# Patient Record
Sex: Female | Born: 1995 | Race: Black or African American | Hispanic: No | Marital: Single | Smoking: Never smoker
Health system: Southern US, Community
[De-identification: ages and names within clinical notes are randomized; demographics above are authoritative.]

---
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
## 2016-09-28 ENCOUNTER — Emergency Department (HOSPITAL_COMMUNITY): Payer: No Typology Code available for payment source

## 2016-09-28 ENCOUNTER — Encounter (HOSPITAL_COMMUNITY): Payer: Self-pay | Admitting: *Deleted

## 2016-09-28 ENCOUNTER — Emergency Department (HOSPITAL_COMMUNITY)
Admission: EM | Admit: 2016-09-28 | Discharge: 2016-09-28 | Disposition: A | Discharge status: Home / Self Care | Payer: No Typology Code available for payment source | Attending: Emergency Medicine | Admitting: Emergency Medicine

## 2016-09-28 DIAGNOSIS — R6889 Other general symptoms and signs: Secondary | ICD-10-CM

## 2016-09-28 DIAGNOSIS — R509 Fever, unspecified: Secondary | ICD-10-CM | POA: Insufficient documentation

## 2016-09-28 DIAGNOSIS — R110 Nausea: Secondary | ICD-10-CM | POA: Insufficient documentation

## 2016-09-28 MED ORDER — ACETAMINOPHEN 325 MG PO TABS
ORAL_TABLET | ORAL | Status: AC
  Filled 2016-09-28: qty 2

## 2016-09-28 MED ORDER — ONDANSETRON 4 MG PO TBDP: 4.0000 mg | ORAL_TABLET | Freq: Three times a day (TID) | ORAL | 0 refills | Status: AC | PRN

## 2016-09-28 MED ORDER — ACETAMINOPHEN 325 MG PO TABS
650.0000 mg | ORAL_TABLET | Freq: Once | ORAL | Status: AC | PRN
  Administered 2016-09-28: 650 mg via ORAL

## 2016-09-28 MED ORDER — BENZONATATE 100 MG PO CAPS: 100.0000 mg | ORAL_CAPSULE | Freq: Three times a day (TID) | ORAL | 0 refills | Status: AC

## 2016-09-28 MED ORDER — IBUPROFEN 800 MG PO TABS: 800.0000 mg | ORAL_TABLET | Freq: Three times a day (TID) | ORAL | 0 refills | Status: AC

## 2016-09-28 NOTE — Discharge Instructions (Signed)
As we discussed, your chest x-ray today was normal.  You likely have a viral illness, possibly the flu. Take the prescribed medications as directed.  Make sure to drink fluids to stay hydrated.  Rest when you can. Follow-up with your primary care doctor. Return to the ED for new or worsening symptoms.

## 2016-09-28 NOTE — ED Provider Notes (Signed)
[facility]-EMERGENCY DEPT Provider Note   CSN: [id number] Arrival date & time: 09/28/16  1417     History   Chief Complaint No chief complaint on file.   HPI Jessica Allison is a 21 y.o. female.  The history is provided by the patient and medical records.    21 y.o. F with no significant PMH presenting to the ED for flu like symptoms x3 days.  Patient reports cough with clear mucous, chest congestion, fever, body aches, Nausea, one episode of emesis this morning, and sore throat. 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 She denies any chest pain or shortness of breath. States symptoms are usually worse in the morning upon waking and gradually improved somewhat during the day. States she has low energy and doesn't really want do anything. She denies any dizziness or syncope.  History reviewed. No pertinent past medical history.  There are no active problems to display for this patient.   History reviewed. No pertinent surgical history.  OB History    No data available       Home Medications    Prior to Admission medications   Medication Sig Start Date End Date Taking? Authorizing Provider  omeprazole (PRILOSEC) 20 MG capsule Take 20 mg by mouth 2 (two) times daily before a meal.    Historical Provider, MD  ondansetron (ZOFRAN) 4 MG tablet Take 1 tablet (4 mg total) by mouth every 6 (six) hours. 03/08/14   Antony Madura, PA-C    Family History No family history on file.  Social History Social History  Substance Use Topics  . Smoking status: Never Smoker  . Smokeless tobacco: Not on file  . Alcohol use No     Allergies   Patient has no known allergies.   Review of Systems Review of Systems  Constitutional: Positive for fever.  HENT: Positive for congestion and sore throat.   Respiratory: Positive for cough.   Gastrointestinal: Positive for nausea and vomiting.  Musculoskeletal: Positive for myalgias.  All other systems reviewed  and are negative.    Physical Exam Updated Vital Signs BP 125/65 (BP Location: Left Arm)   Pulse 105   Temp 103 F (39.4 C) (Oral)   Resp 18   LMP 09/12/2016 (Exact Date)   SpO2 98%   Physical Exam  Constitutional: She is oriented to person, place, and time. She appears well-developed and well-nourished.  HENT:  Head: Normocephalic and atraumatic.  Right Ear: Tympanic membrane and ear canal normal.  Left Ear: Tympanic membrane and ear canal normal.  Nose: Mucosal edema and rhinorrhea present.  Mouth/Throat: Uvula is midline, oropharynx is clear and moist and mucous membranes are normal.  + nasal congestion, clear rhinorrhea, PND noted in oropharynx Tonsils overall normal in appearance bilaterally without exudate; uvula midline without evidence of peritonsillar abscess; handling secretions appropriately; no difficulty swallowing or speaking; normal phonation without stridor  Eyes: Conjunctivae and EOM are normal. Pupils are equal, round, and reactive to light.  Neck: Normal range of motion.  Cardiovascular: Normal rate, regular rhythm and normal heart sounds.   Pulmonary/Chest: Effort normal and breath sounds normal. No respiratory distress. She has no wheezes. She has no rhonchi.  No distress, lungs clear, speaking in sentences without difficulty  Abdominal: Soft. Bowel sounds are normal. There is no tenderness. There is no rebound.  Musculoskeletal: Normal range of motion.  Neurological: She is alert and oriented to person, place, and time.  Skin: Skin is warm and dry.  Psychiatric: She has a normal mood and affect.  Nursing note and vitals reviewed.    ED Treatments / Results  Labs (all labs ordered are listed, but only abnormal results are displayed) Labs Reviewed - No data to display  EKG  EKG Interpretation None       Radiology Dg Chest 2 View  Result Date: 09/28/2016 CLINICAL DATA:  Fever, cough and congestion. Sore throat over the last 2 days. EXAM: CHEST  2  VIEW COMPARISON:  09/04/2005 FINDINGS: Heart size is normal. Mediastinal shadows are normal. The lungs are clear. No bronchial thickening. No infiltrate, mass, effusion or collapse. Pulmonary vascularity is normal. Mild spinal curvature. IMPRESSION: Normal except for mild spinal curvature.  No evidence of pneumonia. Electronically Signed   By: Paulina FusiMark  Shogry M.D.   On: 09/28/2016 16:49    Procedures Procedures (including critical care time)  Medications Ordered in ED Medications  acetaminophen (TYLENOL) 325 MG tablet (not administered)  acetaminophen (TYLENOL) tablet 650 mg (650 mg Oral Given 09/28/16 1444)     Initial Impression / Assessment and Plan / ED Course  I have reviewed the triage vital signs and the nursing notes.  Pertinent labs & imaging results that were available during my care of the patient were reviewed by me and considered in my medical decision making (see chart for details).  Clinical Course    21 year old female here with flulike symptoms for the past 3 days. She was febrile on arrival, nontoxic in appearance. Fever control with Tylenol. Exam with nasal congestion, clear rhinorrhea, postnasal drip, otherwise noninfectious. Lungs are clear without wheezes or rhonchi. Chest x-ray was obtained from triage, no acute findings aside from spinal curvature which is likely chronic. Patient likely with viral process, possibly flu. Given her symptoms have been ongoing for more than 48 hours, she is not a candidate for Tamiflu. Will treat symptomatically. She was given work note. I recommended that she follow-up with her primary care doctor.  Discussed plan with patient, she acknowledged understanding and agreed with plan of care.  Return precautions given for new or worsening symptoms.  Final Clinical Impressions(s) / ED Diagnoses   Final diagnoses:  Flu-like symptoms  Nausea    New Prescriptions New Prescriptions   BENZONATATE (TESSALON) 100 MG CAPSULE    Take 1 capsule (100 mg  total) by mouth every 8 (eight) hours.   IBUPROFEN (ADVIL,MOTRIN) 800 MG TABLET    Take 1 tablet (800 mg total) by mouth 3 (three) times daily.   ONDANSETRON (ZOFRAN ODT) 4 MG DISINTEGRATING TABLET    Take 1 tablet (4 mg total) by mouth every 8 (eight) hours as needed for nausea.     Garlon HatchetLisa M Sanders, PA-C 09/28/16 1931    Charlynne Panderavid Hsienta Yao, MD 09/29/16 [phone]

## 2017-02-02 DIAGNOSIS — H52203 Unspecified astigmatism, bilateral: Secondary | ICD-10-CM | POA: Diagnosis not present

## 2017-02-02 DIAGNOSIS — H5213 Myopia, bilateral: Secondary | ICD-10-CM | POA: Diagnosis not present

## 2017-08-03 IMAGING — DX DG CHEST 2V
2 series · 2 of 2 positions shown · non-contrast
Comparison: 09/04/2005

CLINICAL DATA: Fever, cough and congestion. Sore throat over the
last 2 days.

EXAM:
CHEST  2 VIEW

[chest pa]
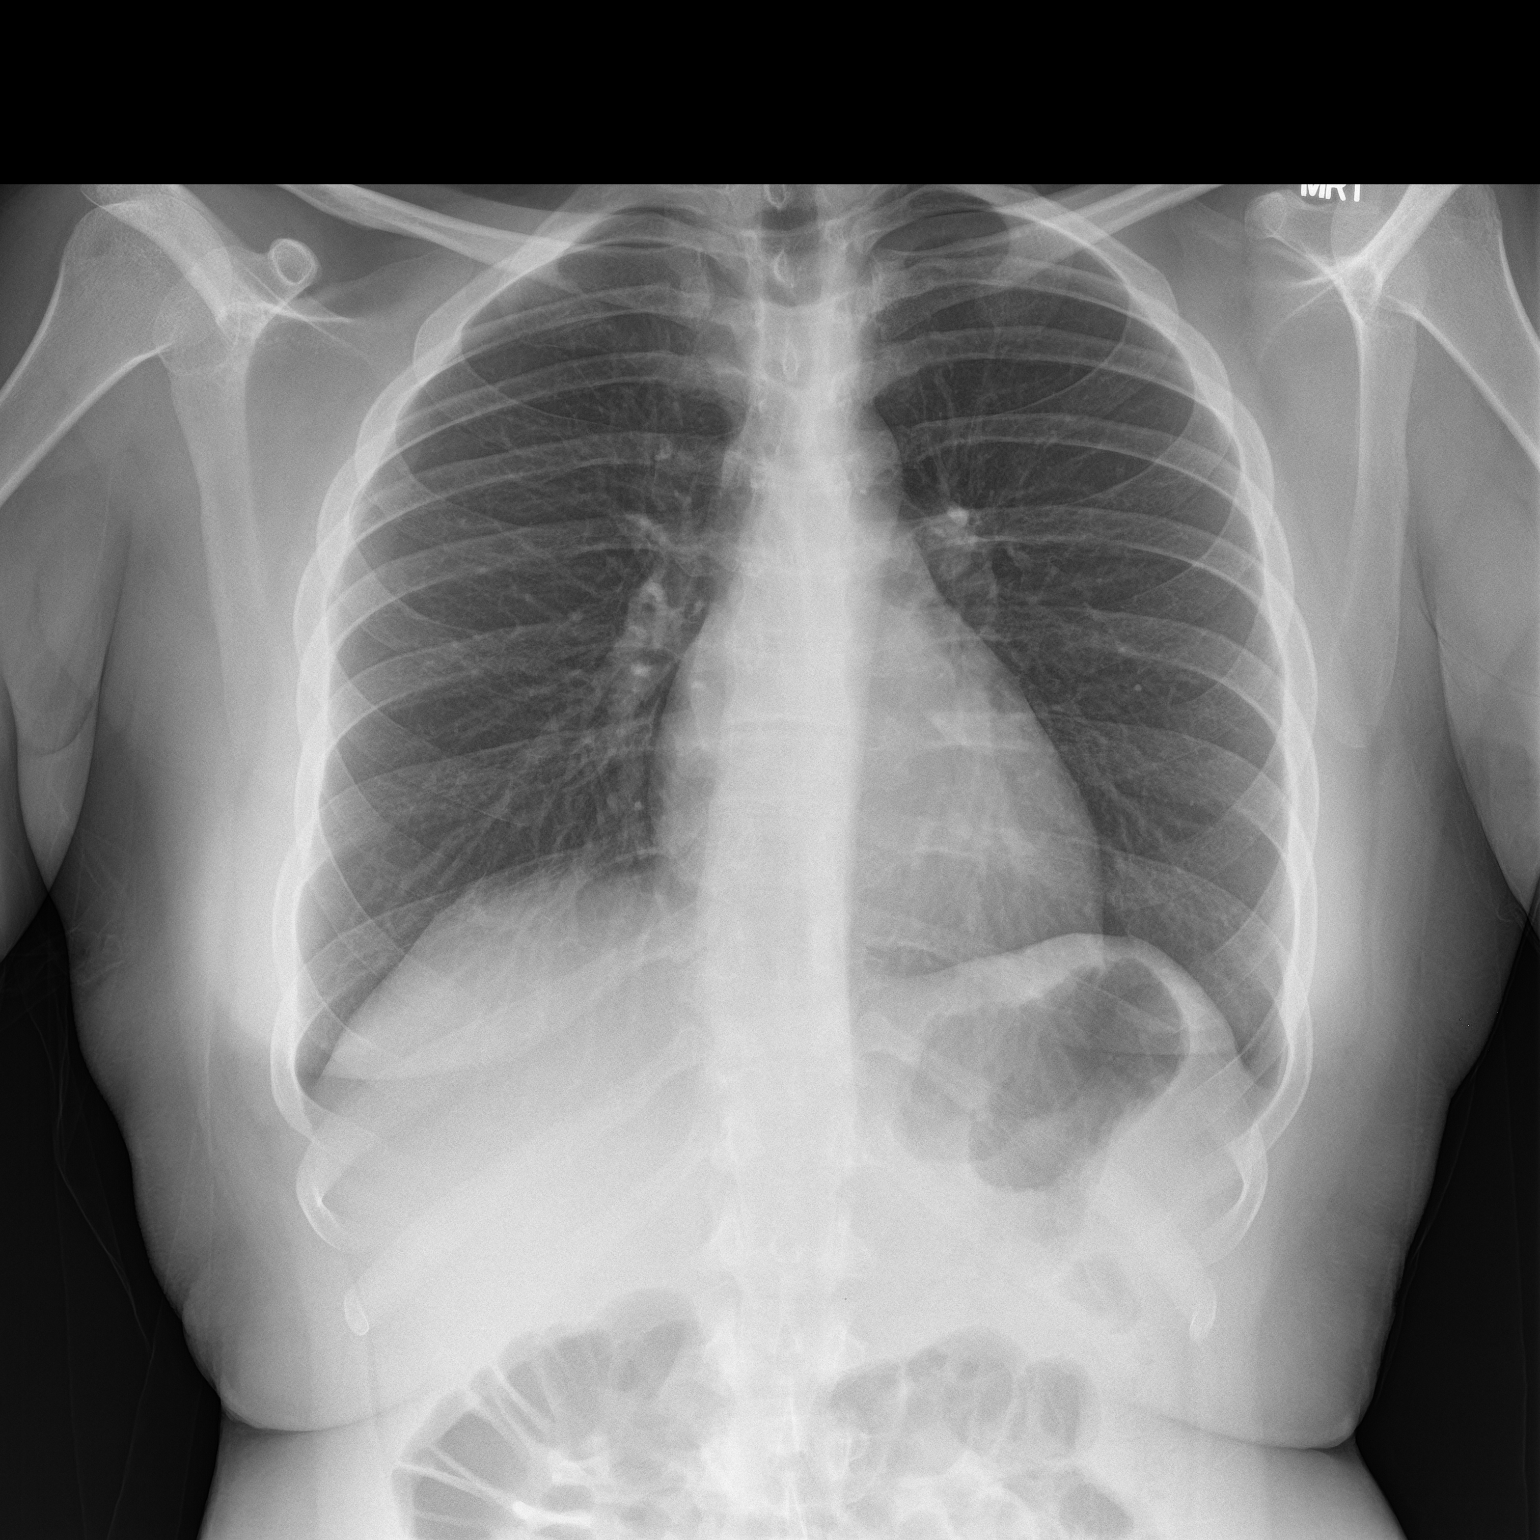

[chest lat]
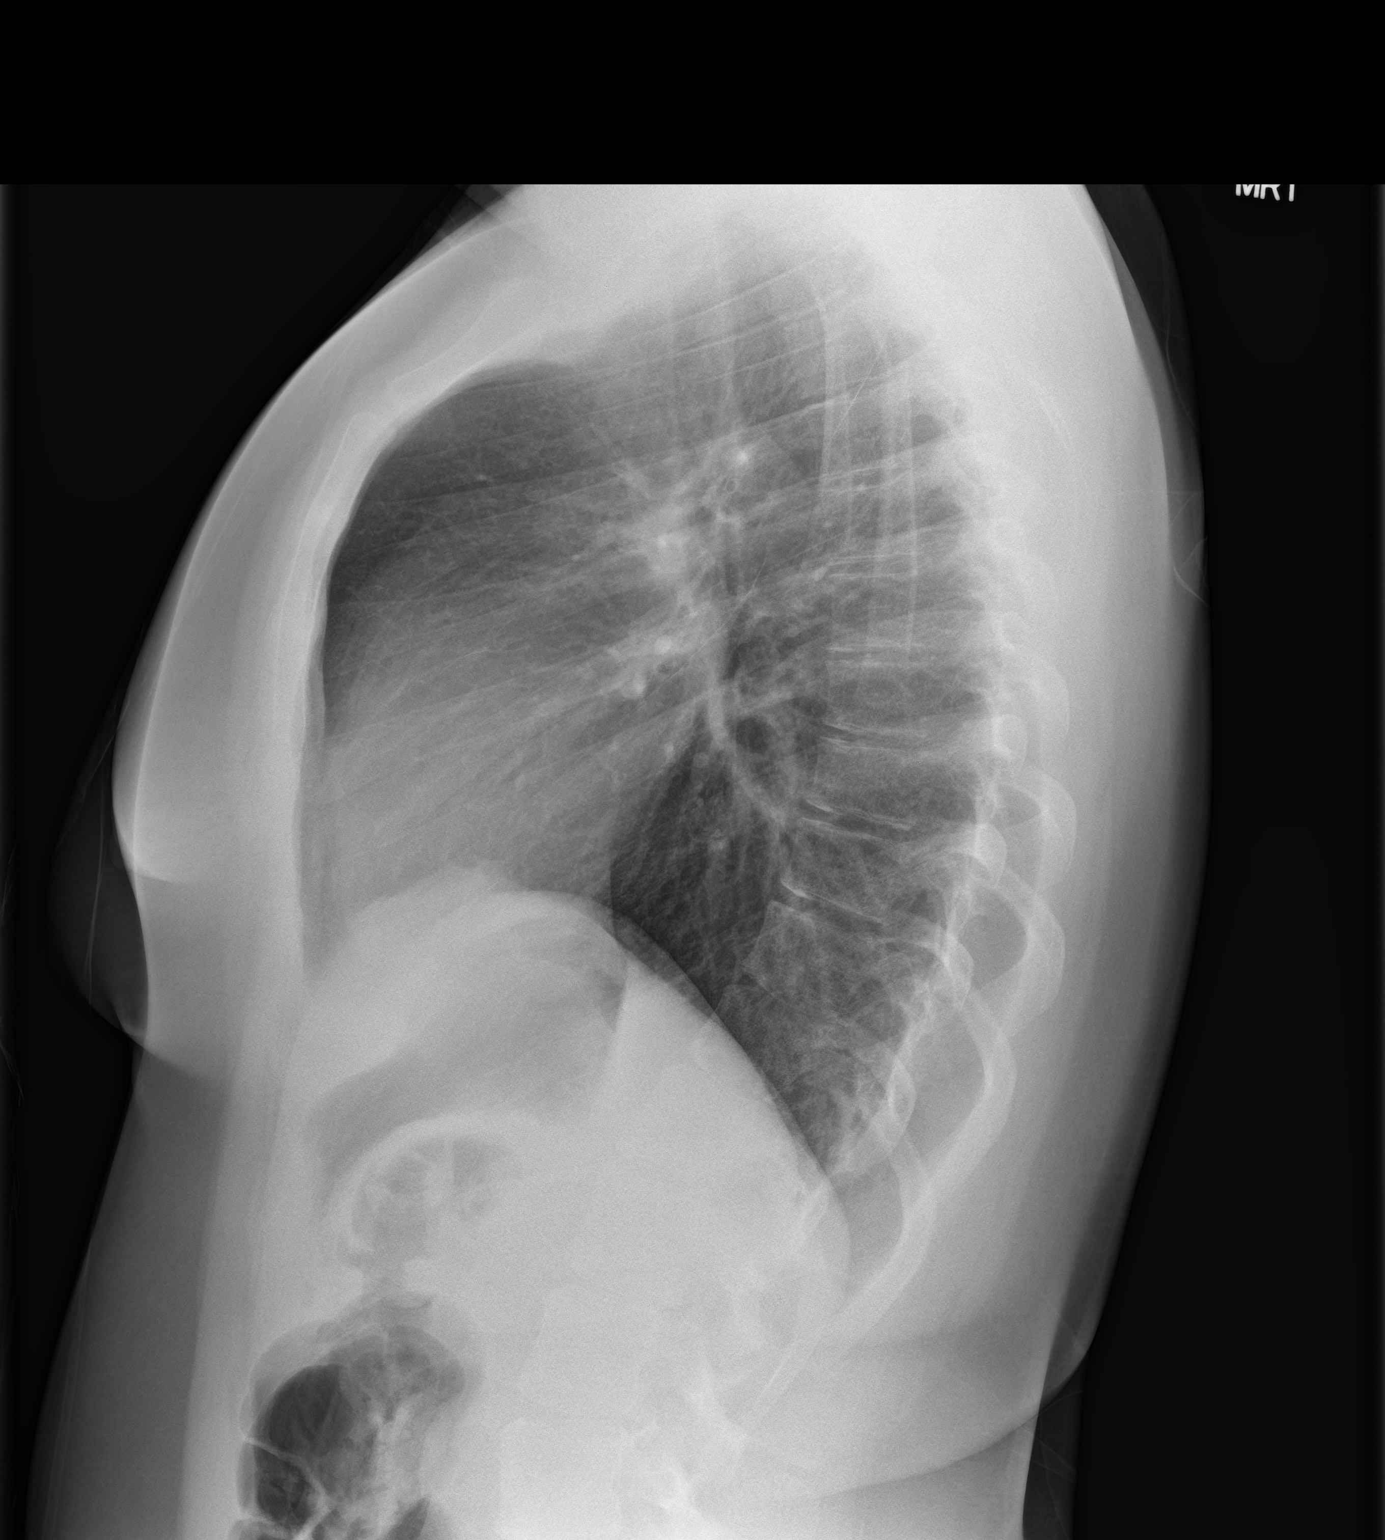

[2 of 2 positions shown; findings below may reference images not displayed]

FINDINGS: Heart size is normal. Mediastinal shadows are normal. The lungs are
clear. No bronchial thickening. No infiltrate, mass, effusion or
collapse. Pulmonary vascularity is normal. Mild spinal curvature.
IMPRESSION: Normal except for mild spinal curvature.  No evidence of pneumonia.

## 2017-08-05 DIAGNOSIS — N898 Other specified noninflammatory disorders of vagina: Secondary | ICD-10-CM | POA: Diagnosis not present

## 2017-08-05 DIAGNOSIS — R399 Unspecified symptoms and signs involving the genitourinary system: Secondary | ICD-10-CM | POA: Diagnosis not present

## 2017-08-05 DIAGNOSIS — Z113 Encounter for screening for infections with a predominantly sexual mode of transmission: Secondary | ICD-10-CM | POA: Diagnosis not present

## 2017-08-05 DIAGNOSIS — Z202 Contact with and (suspected) exposure to infections with a predominantly sexual mode of transmission: Secondary | ICD-10-CM | POA: Diagnosis not present

## 2017-08-05 DIAGNOSIS — N760 Acute vaginitis: Secondary | ICD-10-CM | POA: Diagnosis not present

## 2017-08-05 DIAGNOSIS — N390 Urinary tract infection, site not specified: Secondary | ICD-10-CM | POA: Diagnosis not present

## 2018-06-22 DIAGNOSIS — H52203 Unspecified astigmatism, bilateral: Secondary | ICD-10-CM | POA: Diagnosis not present

## 2018-06-22 DIAGNOSIS — H5213 Myopia, bilateral: Secondary | ICD-10-CM | POA: Diagnosis not present

## 2019-10-30 DIAGNOSIS — N898 Other specified noninflammatory disorders of vagina: Secondary | ICD-10-CM | POA: Diagnosis not present

## 2019-10-30 DIAGNOSIS — N9089 Other specified noninflammatory disorders of vulva and perineum: Secondary | ICD-10-CM | POA: Diagnosis not present

## 2021-01-29 ENCOUNTER — Other Ambulatory Visit: Payer: Self-pay

## 2021-01-29 ENCOUNTER — Emergency Department (HOSPITAL_BASED_OUTPATIENT_CLINIC_OR_DEPARTMENT_OTHER)
Admission: EM | Admit: 2021-01-29 | Discharge: 2021-01-29 | Disposition: A | Discharge status: Home / Self Care | Payer: Self-pay | Attending: Emergency Medicine | Admitting: Emergency Medicine

## 2021-01-29 ENCOUNTER — Encounter (HOSPITAL_BASED_OUTPATIENT_CLINIC_OR_DEPARTMENT_OTHER): Payer: Self-pay | Admitting: Emergency Medicine

## 2021-01-29 DIAGNOSIS — L02412 Cutaneous abscess of left axilla: Secondary | ICD-10-CM | POA: Insufficient documentation

## 2021-01-29 DIAGNOSIS — R000 Tachycardia, unspecified: Secondary | ICD-10-CM | POA: Insufficient documentation

## 2021-01-29 MED ORDER — LIDOCAINE-EPINEPHRINE (PF) 2 %-1:200000 IJ SOLN
20.0000 mL | Freq: Once | INTRAMUSCULAR | Status: AC
  Administered 2021-01-29: 20 mL
  Filled 2021-01-29: qty 20

## 2021-01-29 NOTE — ED Notes (Signed)
Patient verbalizes understanding of discharge instructions. Opportunity for questioning and answers were provided. Armband removed by staff, pt discharged from ED.  

## 2021-01-29 NOTE — ED Triage Notes (Signed)
Left axilla abscess, painful to touch, redness and swelling.  X 3 days

## 2021-01-29 NOTE — Discharge Instructions (Addendum)
You are seen in the emergency department for an abscess in your left armpit.  This area was incised and drained.  There is a packing in there that you can pull out in 2 days.  You may get this wet in the shower.  Return to the emergency department if any worsening or concerning symptoms.

## 2021-01-29 NOTE — ED Provider Notes (Signed)
[facility] EMERGENCY DEPT Provider Note   CSN: [id number] Arrival date & time: 01/29/21  1331     History Chief Complaint  Patient presents with  . Abscess    Left axilla    Jessica Allison is a 25 y.o. female.  She is complaining of a painful area in her left axilla has been there for 3 days.  She said she did not exfoliate and was shaving in both directions and then the lesion came up.  She is tried warm compress without any relief.  No prior history of abscess.  No fevers.  The history is provided by the patient.  Abscess Location:  Shoulder/arm Shoulder/arm abscess location:  L axilla Size:  4 Abscess quality: fluctuance, induration and painful   Red streaking: no   Duration:  3 days Progression:  Worsening Pain details:    Quality:  Throbbing   Severity:  Severe   Timing:  Constant   Progression:  Worsening Chronicity:  New Context: not diabetes   Relieved by:  Nothing Worsened by:  Nothing Ineffective treatments:  Cold compresses and warm compresses Associated symptoms: no fever        History reviewed. No pertinent past medical history.  There are no problems to display for this patient.   History reviewed. No pertinent surgical history.   OB History   No obstetric history on file.     No family history on file.  Social History   Tobacco Use  . Smoking status: Never Smoker  Substance Use Topics  . Alcohol use: No    Home Medications Prior to Admission medications   Medication Sig Start Date End Date Taking? Authorizing Provider  benzonatate (TESSALON) 100 MG capsule Take 1 capsule (100 mg total) by mouth every 8 (eight) hours. 09/28/16   Garlon Hatchet, PA-C  ibuprofen (ADVIL,MOTRIN) 800 MG tablet Take 1 tablet (800 mg total) by mouth 3 (three) times daily. 09/28/16   Garlon Hatchet, PA-C  omeprazole (PRILOSEC) 20 MG capsule Take 20 mg by mouth 2 (two) times daily before a meal.    [provider]  ondansetron  (ZOFRAN ODT) 4 MG disintegrating tablet Take 1 tablet (4 mg total) by mouth every 8 (eight) hours as needed for nausea. 09/28/16   Garlon Hatchet, PA-C  ondansetron (ZOFRAN) 4 MG tablet Take 1 tablet (4 mg total) by mouth every 6 (six) hours. 03/08/14   Antony Madura, PA-C    Allergies    Acetaminophen and Ibuprofen  Review of Systems   Review of Systems  Constitutional: Negative for fever.  Skin: Negative for wound.    Physical Exam Updated Vital Signs BP (!) 129/98 (BP Location: Right Arm)   Pulse (!) 120   Temp 98.4 F (36.9 C) (Oral)   Resp 20   Ht 5\' 5"  (1.651 m)   Wt 94.8 kg   LMP 01/07/2021   SpO2 100%   BMI 34.78 kg/m   Physical Exam Vitals and nursing note reviewed.  Constitutional:      Appearance: Normal appearance. She is well-developed.  HENT:     Head: Normocephalic and atraumatic.  Eyes:     Conjunctiva/sclera: Conjunctivae normal.  Cardiovascular:     Rate and Rhythm: Regular rhythm. Tachycardia present.     Pulses: Normal pulses.  Musculoskeletal:        General: Tenderness present. Normal range of motion.     Cervical back: Neck supple.     Comments: Is an indurated and slightly fluctuant  area approximately 4 cm in her left axilla.  There is no surrounding erythema.  Skin:    General: Skin is warm and dry.  Neurological:     General: No focal deficit present.     Mental Status: She is alert.     GCS: GCS eye subscore is 4. GCS verbal subscore is 5. GCS motor subscore is 6.     ED Results / Procedures / Treatments   Labs (all labs ordered are listed, but only abnormal results are displayed) Labs Reviewed - No data to display  EKG None  Radiology No results found.  Procedures .Marland KitchenIncision and Drainage  Date/Time: 01/29/2021 2:54 PM Performed by: Terrilee Files, MD Authorized by: Terrilee Files, MD   Consent:    Consent obtained:  Verbal   Consent given by:  Patient   Risks discussed:  Bleeding, incomplete drainage, pain and  infection   Alternatives discussed:  No treatment, delayed treatment and referral Universal protocol:    Procedure explained and questions answered to patient or proxy's satisfaction: yes     Patient identity confirmed:  Verbally with patient Location:    Type:  Abscess   Size:  4   Location:  Upper extremity   Upper extremity location:  Arm   Arm location:  L upper arm Pre-procedure details:    Skin preparation:  Povidone-iodine Sedation:    Sedation type:  None Anesthesia:    Anesthesia method:  Local infiltration   Local anesthetic:  Lidocaine 2% WITH epi Procedure type:    Complexity:  Complex Procedure details:    Incision types:  Single straight   Incision depth:  Subcutaneous   Wound management:  Probed and deloculated   Drainage:  Purulent   Drainage amount:  Moderate   Packing materials:  1/4 in gauze   Amount 1/4":  8 Post-procedure details:    Procedure completion:  Tolerated well, no immediate complications     Medications Ordered in ED Medications  lidocaine-EPINEPHrine (XYLOCAINE W/EPI) 2 %-1:200000 (PF) injection 20 mL (has no administration in time range)    ED Course  I have reviewed the triage vital signs and the nursing notes.  Pertinent labs & imaging results that were available during my care of the patient were reviewed by me and considered in my medical decision making (see chart for details).    MDM Rules/Calculators/A&P                           Final Clinical Impression(s) / ED Diagnoses Final diagnoses:  Abscess of left axilla    Rx / DC Orders ED Discharge Orders    None       Terrilee Files, MD 01/29/21 [phone]
